# Patient Record
Sex: Male | Born: 2000
Health system: Southern US, Community
[De-identification: ages and names within clinical notes are randomized; demographics above are authoritative.]

---
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
## 2022-01-05 ENCOUNTER — Other Ambulatory Visit: Payer: Self-pay | Admitting: Family Medicine

## 2022-02-04 ENCOUNTER — Other Ambulatory Visit: Payer: Self-pay | Admitting: Family Medicine

## 2022-03-02 ENCOUNTER — Other Ambulatory Visit: Payer: Self-pay | Admitting: Family Medicine

## 2022-03-05 ENCOUNTER — Ambulatory Visit: Payer: BC Managed Care – PPO | Admitting: Family Medicine

## 2022-03-05 ENCOUNTER — Encounter: Payer: Self-pay | Admitting: Family Medicine

## 2022-03-05 VITALS — BP 120/70 | HR 88 | Temp 99.1°F | Ht 69.0 in | Wt 119.4 lb

## 2022-03-05 DIAGNOSIS — F419 Anxiety disorder, unspecified: Secondary | ICD-10-CM

## 2022-03-05 DIAGNOSIS — F321 Major depressive disorder, single episode, moderate: Secondary | ICD-10-CM

## 2022-03-05 MED ORDER — ARIPIPRAZOLE 5 MG PO TABS: 5.0000 mg | ORAL_TABLET | Freq: Every day | ORAL | 5 refills | Status: DC

## 2022-03-05 NOTE — Patient Instructions (Signed)
It was very nice to see you today!  We will refer you to see a therapist.  Please start the Abilify.  Please send me a message in 1 to 2 weeks to let me know how you are doing.  We can adjust the dose as needed.  If you have any thoughts of hurting herself or someone else please call us or call 911.  Take care, Dr Jimmey Ralph  PLEASE NOTE:  If you had any lab tests please let us know if you have not heard back within a few days. You may see your results on mychart before we have a chance to review them but we will give you a call once they are reviewed by [place]. If we ordered any referrals today, please let us know if you have not heard from their office within the next week.   Please try these tips to maintain a healthy lifestyle:  Eat at least 3 REAL meals and 1-2 snacks per day.  Aim for no more than 5 hours between eating.  If you eat breakfast, please do so within one hour of getting up.   Each meal should contain half fruits/vegetables, one quarter protein, and one quarter carbs (no bigger than a computer mouse)  Cut down on sweet beverages. This includes juice, soda, and sweet tea.   Drink at least 1 glass of water with each meal and aim for at least 8 glasses per day  Exercise at least 150 minutes every week.

## 2022-03-05 NOTE — Assessment & Plan Note (Signed)
Still not controlled.  We will be adding on Abilify to his Cymbalta as below.  They will check with me in 1 to 2 weeks via MyChart.

## 2022-03-05 NOTE — Assessment & Plan Note (Addendum)
Symptoms are still not controlled.  Had lengthy discussion with patient with his father outside of the room regarding his current feelings and plan for treatment.  He does have some feelings of thinking he would be better off dead or if he did not exist though has no plans for suicide and has no homicidal ideation.  He is agreeable to see a therapist and will place referral today.  We discussed referral to psychiatry however patient and his father both deferred for today.     He does feel like the Cymbalta has helped some and would like to continue for now.  He did not have a good response to Celexa in the past.  We discussed augmenting agents.  We will start Abilify 5 mg daily and he will follow-up with me in 1 to 2 weeks.  We can titrate the dose as needed.  We discussed potential side effects of Abilify.  We discussed reasons to return to care or seek emergent care.

## 2022-03-05 NOTE — Progress Notes (Signed)
Dalton Donovan is a 21 y.o. male who presents today for an office visit.  Assessment/Plan:  Chronic Problems Addressed Today: Depression, major, single episode, moderate (HCC) Symptoms are still not controlled.  Had lengthy discussion with patient with his father outside of the room regarding his current feelings and plan for treatment.  He does have some feelings of thinking he would be better off dead or if he did not exist though has no plans for suicide and has no homicidal ideation.  He is agreeable to see a therapist and will place referral today.  We discussed referral to psychiatry however patient and his father both deferred for today.     He does feel like the Cymbalta has helped some and would like to continue for now.  He did not have a good response to Celexa in the past.  We discussed augmenting agents.  We will start Abilify 5 mg daily and he will follow-up with me in 1 to 2 weeks.  We can titrate the dose as needed.  We discussed potential side effects of Abilify.  We discussed reasons to return to care or seek emergent care.  Anxiety Still not controlled.  We will be adding on Abilify to his Cymbalta as below.  They will check with me in 1 to 2 weeks via MyChart.     Subjective:  HPI:  Patient here for follow-up.  He is here today with his father.  He has had worsening depressive symptoms since his last visit.  This has been problematic for his activities of daily living.  It is hard for him to hold a job for more than a week or so.  States that he feels disconnected and has no interest in seeing the people at his work.  Spends most of the time in his room at home.  It is hard for him to talk to other family or her friends about his feelings.  This started him on Cymbalta about 8 months ago.  He does feel like this has helped some and like to continue with this.  He does find enjoyment from making music however has stopped doing a lot of previous hobbies such as playing video  games.  He also tells me that he will occasionally hear sounds in a woman screaming that he knows is not real.  He finds this uncomfortable.  He does have feelings of wishing that he did not exist.  He would be better off dead though has no active plans for suicide.  No HI.  He also feels like things appear distorted and that he is "playing a game" when going through his day.       Objective:  Physical Exam: BP 120/70   Pulse 88   Temp 99.1 F (37.3 C) (Temporal)   Ht 5\' 9"  (1.753 m)   Wt 119 lb 6.4 oz (54.2 kg)   SpO2 99%   BMI 17.63 kg/m   Wt Readings from Last 3 Encounters:  03/05/22 119 lb 6.4 oz (54.2 kg) (4 %, Z= -1.76)*  07/03/21 119 lb 3.2 oz (54.1 kg) (5 %, Z= -1.63)*  05/24/20 125 lb (56.7 kg) (17 %, Z= -0.96)*   * Growth percentiles are based on CDC (Boys, 2-20 Years) data.  Gen: No acute distress, resting comfortably Neuro: Grossly normal, moves all extremities Psych: Flat affect.  Normal thought content.  No SI or HI.  No apparent AVH.  Time Spent: 40 minutes of total time was spent on  the date of the encounter performing the following actions: chart review prior to seeing the patient, obtaining history, performing a medically necessary exam, counseling on the treatment plan including medication changes, placing orders, and documenting in our EHR.       Katina Degree. Jimmey Ralph, MD 03/05/2022 12:35 PM

## 2022-03-19 ENCOUNTER — Telehealth: Payer: Self-pay | Admitting: Family Medicine

## 2022-03-19 NOTE — Telephone Encounter (Signed)
Please have the patient make this request via MyChart message. Unable to increase dosage without patient permission

## 2022-03-19 NOTE — Telephone Encounter (Signed)
Please schedule an appointment with PCP for medication changes

## 2022-03-19 NOTE — Telephone Encounter (Signed)
Please advise 

## 2022-03-19 NOTE — Telephone Encounter (Signed)
Can we have them schedule an appointment or have the patient send [place] a message or mychart note? I can't increase the dose without the patient's permission.  Katina Degree. Jimmey Ralph, MD 03/19/2022 1:02 PM

## 2022-03-19 NOTE — Telephone Encounter (Signed)
Contacted patient to follow up on messages below. He states he is interested in a higher dosage of medication. While he is okay with having an OV, he states he does not have the money at the moment for this. Are there any other options for this situation that are more affordable? Please Advise.

## 2022-03-19 NOTE — Telephone Encounter (Signed)
Patient's father has called in.  Father is concerned for patient.  States that he believes dosage for Abilify needs to be changed to a higher dose.  States patient had informed him that he had made the request.  Father is not on DPR.  Father does understand that patient will need to add him.    Please follow up with patient in regard to medication change.

## 2022-03-21 NOTE — Telephone Encounter (Signed)
FYI--Attempted to call patient to relay messages below. Due to no answer I LVM. Will try to call again later today.

## 2022-03-21 NOTE — Telephone Encounter (Signed)
FYI--Called patient and was able to relay messages below. Pt verbalized understanding and will be sending a Mychart message on the matter below.

## 2022-03-22 ENCOUNTER — Encounter: Payer: Self-pay | Admitting: Family Medicine

## 2022-03-22 NOTE — Telephone Encounter (Signed)
Ok for him to increase abilify to 10mg  daily.  . Katina Degree, MD 03/22/2022 12:58 PM

## 2022-03-23 ENCOUNTER — Other Ambulatory Visit: Payer: Self-pay | Admitting: *Deleted

## 2022-03-23 MED ORDER — ARIPIPRAZOLE 10 MG PO TABS: 10.0000 mg | ORAL_TABLET | Freq: Every day | ORAL | 0 refills | Status: DC

## 2022-03-25 ENCOUNTER — Other Ambulatory Visit: Payer: Self-pay | Admitting: Family Medicine

## 2022-04-09 ENCOUNTER — Ambulatory Visit: Payer: BC Managed Care – PPO | Admitting: Behavioral Health

## 2022-04-27 ENCOUNTER — Other Ambulatory Visit: Payer: Self-pay | Admitting: Family Medicine

## 2022-05-04 ENCOUNTER — Other Ambulatory Visit: Payer: Self-pay | Admitting: Family Medicine

## 2022-05-21 ENCOUNTER — Encounter: Payer: Self-pay | Admitting: *Deleted

## 2022-05-25 ENCOUNTER — Other Ambulatory Visit: Payer: Self-pay | Admitting: Family Medicine

## 2022-06-01 ENCOUNTER — Other Ambulatory Visit: Payer: Self-pay | Admitting: Family Medicine

## 2022-06-19 ENCOUNTER — Other Ambulatory Visit: Payer: Self-pay | Admitting: Family Medicine

## 2022-06-20 ENCOUNTER — Encounter: Payer: Self-pay | Admitting: Family Medicine

## 2022-06-21 MED ORDER — ARIPIPRAZOLE 10 MG PO TABS: 10.0000 mg | ORAL_TABLET | Freq: Every day | ORAL | 0 refills | Status: DC

## 2022-06-21 NOTE — Telephone Encounter (Signed)
Ok to refill his abilify. Please have him schedule a follow up soon.  Dalton Donovan. Jerline Pain, MD 06/21/2022 2:29 PM

## 2022-06-27 ENCOUNTER — Other Ambulatory Visit: Payer: Self-pay | Admitting: Family Medicine

## 2022-07-23 ENCOUNTER — Other Ambulatory Visit: Payer: Self-pay | Admitting: Family Medicine

## 2022-08-09 ENCOUNTER — Encounter: Payer: Self-pay | Admitting: *Deleted

## 2023-08-27 ENCOUNTER — Telehealth (INDEPENDENT_AMBULATORY_CARE_PROVIDER_SITE_OTHER): Payer: Medicaid Other | Admitting: Family Medicine

## 2023-08-27 VITALS — Ht 69.0 in | Wt 130.0 lb

## 2023-08-27 DIAGNOSIS — F17200 Nicotine dependence, unspecified, uncomplicated: Secondary | ICD-10-CM | POA: Insufficient documentation

## 2023-08-27 MED ORDER — VARENICLINE TARTRATE 0.5 MG PO TABS: ORAL_TABLET | ORAL | 0 refills | Status: DC

## 2023-08-27 MED ORDER — VARENICLINE TARTRATE 1 MG PO TABS: 1.0000 mg | ORAL_TABLET | Freq: Two times a day (BID) | ORAL | 5 refills | Status: AC

## 2023-08-27 NOTE — Assessment & Plan Note (Addendum)
 Patient is currently ready to quit. We reviewed treatment options to assist him quit nicotine use including NRT, Chantix , and Bupropion.  He is currently using vape extensively.  No other sources of nicotine use.    He would like to avoid nicotine replacement therapy at this point.  Will try Chantix .  We did discuss potential side effects.  He will try to gradually wean down all nicotine use while on Chantix  over the next several weeks.  He will follow-up with us  in a few weeks via MyChart.  He will come back soon for physical and we can follow-up at that time as well.  Also discussed strategies to replicate the hand to mouth motion as well such as sugar-free hard candies, gum, or nicotine free vape products.

## 2023-08-27 NOTE — Progress Notes (Signed)
   Dalton Donovan is a 22 y.o. male who presents today for a virtual office visit.  Assessment/Plan:  Chronic Problems Addressed Today: Nicotine dependence with current use Patient is currently ready to quit. We reviewed treatment options to assist him quit nicotine use including NRT, Chantix , and Bupropion.  He is currently using vape extensively.  No other sources of nicotine use.    He would like to avoid nicotine replacement therapy at this point.  Will try Chantix .  We did discuss potential side effects.  He will try to gradually wean down all nicotine use while on Chantix  over the next several weeks.  He will follow-up with us  in a few weeks via MyChart.  He will come back soon for physical and we can follow-up at that time as well.  Also discussed strategies to replicate the hand to mouth motion as well such as sugar-free hard candies, gum, or nicotine free vape products.    Subjective:  HPI:  See Assessment / plan for status of chronic conditions. He is here today to discuss vaping use. He would like to stop using nicotine vapes. He has tried cutting down but has not been successful.  He does get significant nicotine cravings when he goes a few hours without any nicotine.  This can sometimes cause him to be more irritable and pressured speech and avoid certain social situations.       Objective/Observations  Physical Exam: Gen: NAD, resting comfortably Pulm: Normal work of breathing Neuro: Grossly normal, moves all extremities Psych: Normal affect and thought content  Virtual Visit via Video   I connected with Dalton Donovan on 08/27/23 at  1:40 PM EST by a video enabled telemedicine application and verified that I am speaking with the correct person using two identifiers. The limitations of evaluation and management by telemedicine and the availability of in person appointments were discussed. The patient expressed understanding and agreed to proceed.   Patient location:  Home Provider location: [place] [facility] [employer] Persons participating in the virtual visit: Myself and Patient     Worth HERO. Kennyth, MD 08/27/2023 2:17 PM

## 2023-08-29 ENCOUNTER — Encounter: Payer: Self-pay | Admitting: Family Medicine

## 2023-08-30 ENCOUNTER — Other Ambulatory Visit: Payer: Self-pay | Admitting: *Deleted

## 2023-08-30 MED ORDER — VARENICLINE TARTRATE 0.5 MG PO TABS: ORAL_TABLET | ORAL | 0 refills | Status: AC

## 2023-10-31 DIAGNOSIS — J029 Acute pharyngitis, unspecified: Secondary | ICD-10-CM | POA: Diagnosis not present

## 2023-10-31 DIAGNOSIS — Z03818 Encounter for observation for suspected exposure to other biological agents ruled out: Secondary | ICD-10-CM | POA: Diagnosis not present

## 2023-10-31 DIAGNOSIS — J101 Influenza due to other identified influenza virus with other respiratory manifestations: Secondary | ICD-10-CM | POA: Diagnosis not present

## 2023-10-31 DIAGNOSIS — R051 Acute cough: Secondary | ICD-10-CM | POA: Diagnosis not present

## 2023-10-31 DIAGNOSIS — R509 Fever, unspecified: Secondary | ICD-10-CM | POA: Diagnosis not present

## 2023-11-12 ENCOUNTER — Encounter: Payer: Medicaid Other | Admitting: Family Medicine

## 2024-05-30 ENCOUNTER — Other Ambulatory Visit: Payer: Self-pay

## 2024-05-30 ENCOUNTER — Emergency Department: Admission: EM | Admit: 2024-05-30 | Discharge: 2024-05-30 | Disposition: A | Discharge status: Home / Self Care

## 2024-05-30 DIAGNOSIS — H9202 Otalgia, left ear: Secondary | ICD-10-CM | POA: Diagnosis present

## 2024-05-30 DIAGNOSIS — H6022 Malignant otitis externa, left ear: Secondary | ICD-10-CM | POA: Insufficient documentation

## 2024-05-30 MED ORDER — OFLOXACIN 0.3 % OT SOLN: 5.0000 [drp] | Freq: Two times a day (BID) | OTIC | 0 refills | Status: AC

## 2024-05-30 NOTE — ED Triage Notes (Signed)
 Pt to ED for L otalgia since months but getting worse and now pain is spreading to his jaw since last 3-4 days.

## 2024-05-30 NOTE — Discharge Instructions (Addendum)
 You have been diagnosed with left otitis externa.  Please apply ofloxacin 5 drops in the left ear 2 times daily for 7 days.  Please call Dr. Herminio and make an appointment next week for a follow-up.  Please come back to ED or go to your PCP if you have new symptoms or symptoms worsen

## 2024-05-30 NOTE — ED Provider Notes (Signed)
 [facility] Provider Note    Event Date/Time   First MD Initiated Contact with Patient 05/30/24 1828     (approximate)   History   Otalgia    HPI  Dalton Donovan is a 23 y.o. male    with a past medical history of depression, bronchitis,, with no significant past medical history who presents to the ED complaining of left otalgia. According to the patient, symptoms started 6 months ago, patient endorses sound produces ear pain, and he hears muffled sounds.  He denies fever, chills.  The last 2 weeks patient reports having serous secretion, and pain that extends to the TMJ.     Patient Active Problem List   Diagnosis Date Noted   Nicotine dependence with current use 08/27/2023   Anxiety 09/18/2018   Depression, major, single episode, moderate (HCC) 09/03/2018   Viral warts 09/03/2018     ROS: Patient currently denies any vision changes, tinnitus, difficulty speaking, facial droop, sore throat, chest pain, shortness of breath, abdominal pain, nausea/vomiting/diarrhea, dysuria, or weakness/numbness/paresthesias in any extremity   Physical Exam   Triage Vital Signs: ED Triage Vitals  Encounter Vitals Group     BP 05/30/24 1654 122/68     Girls Systolic BP Percentile --      Girls Diastolic BP Percentile --      Boys Systolic BP Percentile --      Boys Diastolic BP Percentile --      Pulse Rate 05/30/24 1654 82     Resp 05/30/24 1654 16     Temp 05/30/24 1654 98.2 F (36.8 C)     Temp Source 05/30/24 1654 Oral     SpO2 05/30/24 1654 100 %     Weight 05/30/24 1651 130 lb (59 kg)     Height 05/30/24 1651 5' 10 (1.778 m)     Head Circumference --      Peak Flow --      Pain Score 05/30/24 1649 3     Pain Loc --      Pain Education --      Exclude from Growth Chart --     Most recent vital signs: Vitals:   05/30/24 1654  BP: 122/68  Pulse: 82  Resp: 16  Temp: 98.2 F (36.8 C)  SpO2: 100%     Physical Exam Vitals and nursing note  reviewed.  During triage vital signs were normal  General:          Awake, no distress.  CV:                  Good peripheral perfusion.  Resp:               Normal effort. no tachypnea Abd:                 No distention.  Soft nontender Other:              Left otoscopy: Presence of dry yellowish material.  Unable to see the tympanic membrane.  No evidence of drainage of the moment of the physical exam.  No tenderness to palpation in the tragus.  No tenderness to palpation in TMJ.  I was able to remove some dry material from his ear but unable to see the tympanic membrane. ED Results / Procedures / Treatments   Labs (all labs ordered are listed, but only abnormal results are displayed) Labs Reviewed - No data to display    PROCEDURES:  Critical Care performed:   Procedures   MEDICATIONS ORDERED IN ED: Medications - No data to display    IMPRESSION / MDM / ASSESSMENT AND PLAN / ED COURSE  I reviewed the triage vital signs and the nursing notes.  Differential diagnosis includes, but is not limited to, otitis externa, otitis media, foreign body.  Patient's presentation is most consistent with acute, uncomplicated illness.   Dalton Donovan is a 23 y.o., male presents today with history of 6 months of left ear pain, that increases with the sound.  Endorses the sound is muffled.  The last 2 weeks patient noticed drainage coming from his left ear.  Patient denies fever, chills, nasal congestion.  Physical exam left otoscopy I was unable to see the tympanic membrane, I did remove yellowish dry material, ear canal is with edema no erythema.  Tragus is not tender to palpation.  TMJ is within normal limits. Patient's diagnosis is consistent with otitis externa. I did not order any imaging or labs physical exam was reassuring I did review the patient's allergies and medications.The patient is in stable and satisfactory condition for discharge home  Patient will be discharged home with  prescriptions for ofloxacin otic drops. Patient is to follow up with ENT as needed or otherwise directed. Patient is given ED precautions to return to the ED for any worsening or new symptoms. Discussed plan of care with patient, answered all of patient's questions, Patient agreeable to plan of care. Advised patient to take medications according to the instructions on the label. Discussed possible side effects of new medications. Patient verbalized understanding.  FINAL CLINICAL IMPRESSION(S) / ED DIAGNOSES   Final diagnoses:  Acute malignant otitis externa of left ear     Rx / DC Orders   ED Discharge Orders          Ordered    ofloxacin (FLOXIN) 0.3 % OTIC solution  2 times daily        05/30/24 1853             Note:  This document was prepared using Dragon voice recognition software and may include unintentional dictation errors.   Janit Kast, PA-C 05/30/24 1854    Clarine Ozell LABOR, MD 05/30/24 [phone]

## 2024-06-04 ENCOUNTER — Telehealth: Payer: Self-pay

## 2024-06-04 NOTE — Telephone Encounter (Signed)
 Transition Care Management Unsuccessful Follow-up Telephone Call  Date of discharge and from where:  05/30/24 [facility] ED  Attempts:  1st Attempt  Reason for unsuccessful TCM follow-up call:  Left voice message  for patient attempting to complete [facility] call regarding recent ED visit. Advised to return call and schedule ED follow up with PCP if needed. Pt also advised to schedule follow up with Herminio Miu, MD (Otolaryngology) in 2 days (06/01/2024) for a follow-up.
